# Patient Record
Sex: Male | Born: 1975 | Race: White | Hispanic: No | Marital: Single | Smoking: Never smoker
Health system: Southern US, Community
[De-identification: ages and names within clinical notes are randomized; demographics above are authoritative.]

## PROBLEM LIST (undated history)

## (undated) DIAGNOSIS — F419 Anxiety disorder, unspecified: Secondary | ICD-10-CM

## (undated) DIAGNOSIS — F32A Depression, unspecified: Secondary | ICD-10-CM

## (undated) DIAGNOSIS — F329 Major depressive disorder, single episode, unspecified: Secondary | ICD-10-CM

---

## 2014-07-16 ENCOUNTER — Emergency Department (INDEPENDENT_AMBULATORY_CARE_PROVIDER_SITE_OTHER): Payer: Self-pay

## 2014-07-16 ENCOUNTER — Emergency Department
Admission: EM | Admit: 2014-07-16 | Discharge: 2014-07-16 | Disposition: A | Discharge status: Home / Self Care | Payer: Self-pay | Source: Home / Self Care | Attending: Family Medicine | Admitting: Family Medicine

## 2014-07-16 ENCOUNTER — Encounter: Payer: Self-pay | Admitting: Emergency Medicine

## 2014-07-16 DIAGNOSIS — M79644 Pain in right finger(s): Secondary | ICD-10-CM

## 2014-07-16 DIAGNOSIS — S63641A Sprain of metacarpophalangeal joint of right thumb, initial encounter: Secondary | ICD-10-CM

## 2014-07-16 HISTORY — DX: Depression, unspecified: F32.A

## 2014-07-16 HISTORY — DX: Major depressive disorder, single episode, unspecified: F32.9

## 2014-07-16 HISTORY — DX: Anxiety disorder, unspecified: F41.9

## 2014-07-16 NOTE — ED Notes (Signed)
Reports injuring right thumb about 8 days ago while moving furniture; feels worse than previous jammed fingers and has difficulty moving it.

## 2014-07-16 NOTE — Discharge Instructions (Signed)
Apply ice pack for 20 to 30 minutes, 3 to 4 times daily  Continue until pain decreases.  Wear brace for 10 to 14 days.  May take Ibuprofen 200mg , 4 tabs every 8 hours with food.  Begin exercises at about 10 days.   Gamekeeper's, Skier's Thumb You have injured some ligaments in your thumb. This can happen suddenly or over a long period of time. It may be difficult for you to hold things by pinching them between your thumb and finger. The injury may take 6 to 8 weeks to heal. Your injury is a strain injury and not a complete tear so it may be treated with a cast. A complete tear of the ligament can lead to a loss of function of the thumb if not fixed surgically. It got its name from when gamekeepers used to kill game (hunted or trapped animals) by pinning them down around the neck between their thumb and index finger.  HOME CARE INSTRUCTIONS   Apply ice to the injury for 15-20 minutes, 03-04 times per day for the first 2 days. Put the ice in a plastic bag and place a towel between the bag of ice and your skin.  Avoid using your thumb for as long as directed by your caregiver if it is not splinted or in a cast.  If your hand has been casted or splinted while your thumb heals, follow these instructions:  Plaster or fiberglass cast:  Do not try to scratch the skin under the cast using a sharp or pointed object.  Check the skin around the cast every day. You may put lotion on any red or sore areas.  Keep your cast dry. Your cast can be protected during bathing with a plastic bag. Do not put your cast into the water.  If your fiberglass cast gets wet, it can be gently dried using a hair dryer. Be careful not burn yourself.  Plaster splint:  Wear the splint for as long as directed by your caregiver or until you are seen for a follow-up examination.  Do not get your splint wet. Protect it during bathing with a plastic bag.  You may loosen the elastic bandage around the splint if your fingers  start to get numb, tingle, get cold, or turn blue.  Do not put pressure on your cast or splint; this may cause it to break. Do not lean on hard surfaces for 24 hours after application.  Only take over-the-counter or prescription medicines for pain, discomfort, or fever as directed by your caregiver.  IMPORTANT: follow up with your caregiver or keep or call for any appointments with specialists as directed. The failure to follow up could result in chronic pain and / or disability. SEEK IMMEDIATE MEDICAL CARE IF:  Your thumb or fingers change color, become painful, or there is numbness or tingling in your thumb or fingers. Your cast or splint may be too tight. MAKE SURE YOU:   Understand these instructions.  Will watch your condition.  Will get help right away if you are not doing well or get worse. Document Released: 03/28/2005 Document Revised: 06/20/2011 Document Reviewed: 11/15/2007 [facility] Patient Information 2015 [place], [place]. This information is not intended to replace advice given to you by your health care provider. Make sure you discuss any questions you have with your health care provider.

## 2014-07-16 NOTE — ED Provider Notes (Signed)
CSN: [id number]     Arrival date & time 07/16/14  1643 History   First MD Initiated Contact with Patient 07/16/14 1703     Chief Complaint  Patient presents with  . Hand Pain      HPI Comments: About 8 days ago, while lifting a couch, the couch fell back and hyperextended his right thumb.  He has had persistent pain in his right thumb  Patient is a 39 y.o. male presenting with hand injury. The history is provided by the patient.  Hand Injury Location:  Finger Time since incident:  8 days Injury: yes   Mechanism of injury comment:  Hyperextended right thumb Finger location:  R thumb Pain details:    Quality:  Dull   Severity:  Mild   Onset quality:  Sudden   Duration:  8 hours   Timing:  Constant   Progression:  Unchanged Chronicity:  New Dislocation: no   Prior injury to area:  No Relieved by:  Nothing Worsened by:  Movement Ineffective treatments:  None tried Associated symptoms: decreased range of motion, stiffness and swelling   Associated symptoms: no muscle weakness, no numbness and no tingling     Past Medical History  Diagnosis Date  . Anxiety   . Depression    History reviewed. No pertinent past surgical history. History reviewed. No pertinent family history. History  Substance Use Topics  . Smoking status: Never Smoker   . Smokeless tobacco: Not on file  . Alcohol Use: No    Review of Systems  Musculoskeletal: Positive for stiffness.  All other systems reviewed and are negative.   Allergies  Review of patient's allergies indicates no known allergies.  Home Medications   Prior to Admission medications   Medication Sig Start Date End Date Taking? Authorizing Provider  buPROPion (WELLBUTRIN XL) 300 MG 24 hr tablet Take 300 mg by mouth daily.   Yes Historical Provider, MD  clonazePAM (KLONOPIN) 0.5 MG tablet Take 0.5 mg by mouth 2 (two) times daily as needed for anxiety.   Yes Historical Provider, MD   BP 123/78 mmHg  Pulse 78  Temp(Src) 97.9 F  (36.6 C) (Oral)  Resp 16  Ht 5\' 8"  (1.727 m)  Wt 160 lb (72.576 kg)  BMI 24.33 kg/m2  SpO2 100% Physical Exam  Constitutional: He is oriented to person, place, and time. He appears well-developed and well-nourished. No distress.  Eyes: Pupils are equal, round, and reactive to light.  Musculoskeletal:       Right hand: He exhibits decreased range of motion, tenderness and bony tenderness. He exhibits normal two-point discrimination, normal capillary refill, no deformity and no swelling. Normal sensation noted.       Hands: Right thumb has distinct tenderness over the ulnar collateral ligament at MCP joint.  MCP joint is stable.  Neurological: He is alert and oriented to person, place, and time.  Skin: Skin is warm and dry.  Nursing note and vitals reviewed.   ED Course  Procedures  none  Imaging Review Dg Finger Thumb Right  07/16/2014   CLINICAL DATA:  Right thumb injury.  EXAM: RIGHT THUMB 2+V  COMPARISON:  None.  FINDINGS: There is no evidence of fracture or dislocation. There is no evidence of arthropathy or other focal bone abnormality. Soft tissues are unremarkable.  IMPRESSION: No acute abnormality.   Electronically Signed   By: Richarda OverlieAdam  Henn M.D.   On: 07/16/2014 17:19     MDM   1. Skier's thumb, right, initial  encounter    Thumb spica splint applied. Apply ice pack for 20 to 30 minutes, 3 to 4 times daily  Continue until pain decreases.  Wear brace for 10 to 14 days.  May take Ibuprofen , 4 tabs every 8 hours with food.  Begin exercises at about 10 days. Followup with Dr. [facility]) if not improving about two weeks.     Lattie Haw, MD 07/18/14 [phone]

## 2016-11-07 IMAGING — CR DG FINGER THUMB 2+V*R*
3 series · 3 of 3 positions shown · non-contrast
Comparison: None.

CLINICAL DATA: Right thumb injury.

EXAM:
RIGHT THUMB 2+V

[finger ap]
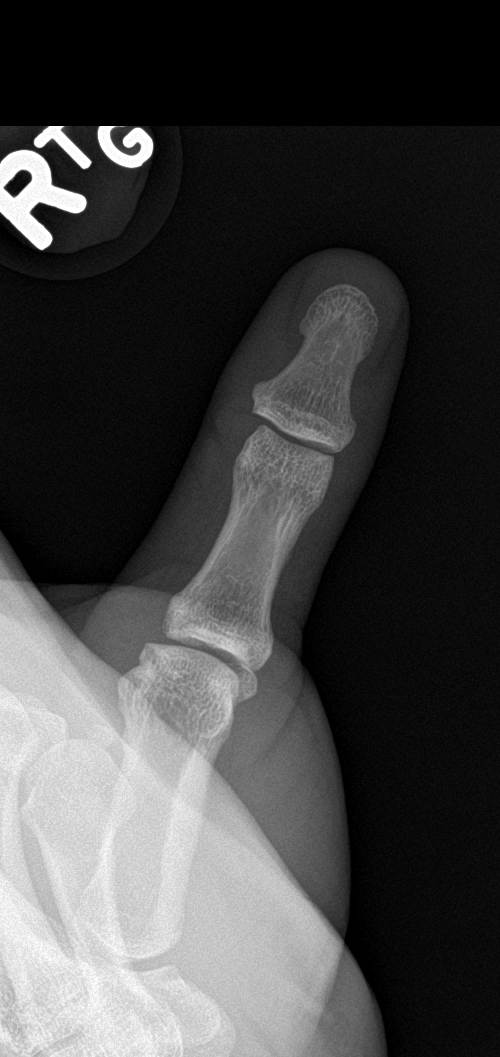

[finger obl]
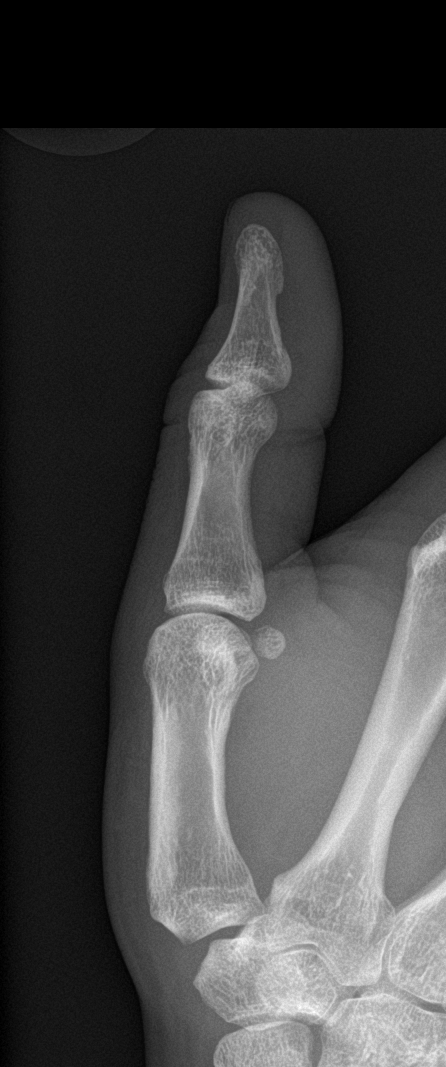

[finger lat]
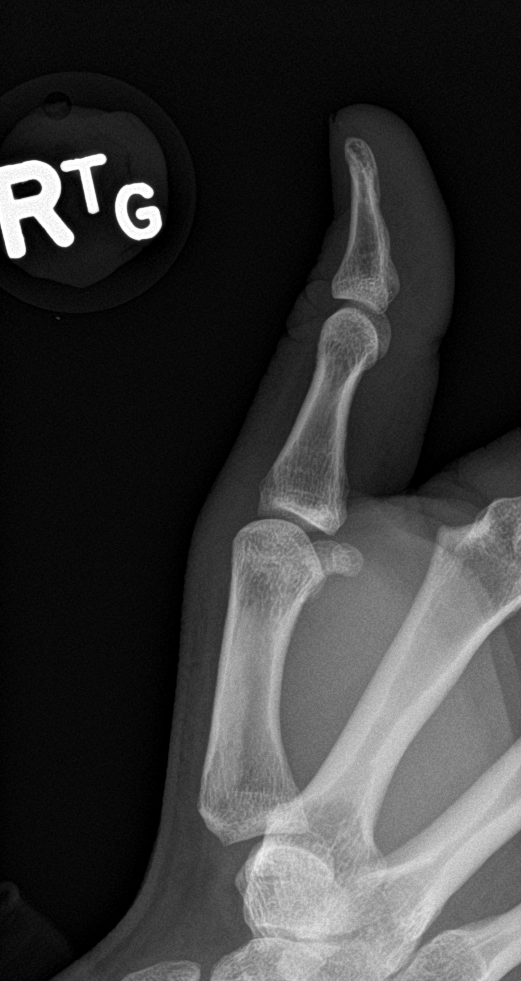

[3 of 3 positions shown; findings below may reference images not displayed]

FINDINGS: There is no evidence of fracture or dislocation. There is no
evidence of arthropathy or other focal bone abnormality. Soft
tissues are unremarkable.
IMPRESSION: No acute abnormality.
